# Patient Record
Sex: Male | Born: 2000 | Race: Black or African American | Hispanic: No | Marital: Single | Smoking: Never smoker
Health system: Southern US, Community
[De-identification: ages and names within clinical notes are randomized; demographics above are authoritative.]

---
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
## 2019-06-05 ENCOUNTER — Other Ambulatory Visit: Payer: Self-pay

## 2019-06-05 ENCOUNTER — Emergency Department: Payer: Self-pay

## 2019-06-05 ENCOUNTER — Emergency Department
Admission: EM | Admit: 2019-06-05 | Discharge: 2019-06-05 | Disposition: A | Discharge status: Home / Self Care | Payer: Self-pay | Attending: Emergency Medicine | Admitting: Emergency Medicine

## 2019-06-05 DIAGNOSIS — G44209 Tension-type headache, unspecified, not intractable: Secondary | ICD-10-CM | POA: Insufficient documentation

## 2019-06-05 DIAGNOSIS — Z79899 Other long term (current) drug therapy: Secondary | ICD-10-CM | POA: Insufficient documentation

## 2019-06-05 DIAGNOSIS — J029 Acute pharyngitis, unspecified: Secondary | ICD-10-CM | POA: Insufficient documentation

## 2019-06-05 LAB — GROUP A STREP BY PCR: Group A Strep by PCR: NOT DETECTED

## 2019-06-05 MED ORDER — LIDOCAINE VISCOUS HCL 2 % MT SOLN: 5.0000 mL | Freq: Four times a day (QID) | OROMUCOSAL | 0 refills | Status: AC | PRN

## 2019-06-05 MED ORDER — DICLOFENAC SODIUM 50 MG PO TBEC: 50.0000 mg | DELAYED_RELEASE_TABLET | Freq: Two times a day (BID) | ORAL | 0 refills | Status: AC

## 2019-06-05 NOTE — ED Provider Notes (Signed)
[facility] Emergency Department Provider Note   ____________________________________________   First MD Initiated Contact with Patient 06/05/19 [phone]     (approximate)  I have reviewed the triage vital signs and the nursing notes.   HISTORY  Chief Complaint Headache, Sore Throat, and Generalized Body Aches    HPI Chad Ortiz is a 18 y.o. male patient complain of sore throat and increasing bitemporal headache for 5 days.  Patient that head increases with flexion and lateral movements.  Patient state intimating transient vertigo.  Patient denies vision disturbance or weakness.  Patient rates his pain as 8/10.  Patient described pain is "achy". Mild relief with over-the-counter anti-inflammatory medications.      History reviewed. No pertinent past medical history.  There are no active problems to display for this patient.   History reviewed. No pertinent surgical history.  Prior to Admission medications   Medication Sig Start Date End Date Taking? Authorizing Provider  cetirizine HCl (ZYRTEC) 5 MG/5ML SOLN Take 5 mLs (5 mg total) by mouth daily. 03/21/17 04/04/17  Lannie Fields, PA-C  diclofenac (VOLTAREN) 50 MG EC tablet Take 1 tablet (50 mg total) by mouth 2 (two) times daily. 06/05/19   Sable Feil, PA-C  lidocaine (XYLOCAINE) 2 % solution Use as directed 5 mLs in the mouth or throat every 6 (six) hours as needed for mouth pain. Oral swish and swallow. 06/05/19   Sable Feil, PA-C    Allergies Patient has no known allergies.  No family history on file.  Social History Social History   Tobacco Use  . Smoking status: Never Smoker  Substance Use Topics  . Alcohol use: Not on file  . Drug use: Not on file    Review of Systems Constitutional: No fever/chills Eyes: No visual changes. ENT: Sore throat Cardiovascular: Denies chest pain. Respiratory: Denies shortness of breath. Gastrointestinal: No abdominal pain.  No nausea,  no vomiting.  No diarrhea.  No constipation. Genitourinary: Negative for dysuria. Musculoskeletal: Negative for back pain. Skin: Negative for rash. Neurological: Positive for headaches, but denies focal weakness or numbness.   ____________________________________________   PHYSICAL EXAM:  VITAL SIGNS: ED Triage Vitals  Enc Vitals Group     BP 06/05/19 0857 (!) 145/70     Pulse Rate 06/05/19 0857 71     Resp 06/05/19 0857 18     Temp 06/05/19 0857 98.7 F (37.1 C)     Temp Source 06/05/19 0857 Oral     SpO2 06/05/19 0857 97 %     Weight 06/05/19 0858 135 lb (61.2 kg)     Height 06/05/19 0858 5\' 10"  (1.778 m)     Head Circumference --      Peak Flow --      Pain Score 06/05/19 0858 8     Pain Loc --      Pain Edu? --      Excl. in [place]? --    Constitutional: Alert and oriented. Well appearing and in no acute distress. Eyes: Conjunctivae are normal. PERRL. EOMI. Head: Atraumatic. Mouth/Throat: Mucous membranes are moist.  Oropharynx erythematous.  No visible exudate. Neck: No stridor.  Hematological/Lymphatic/Immunilogical: No cervical lymphadenopathy. Cardiovascular: Normal rate, regular rhythm. Grossly normal heart sounds.  Good peripheral circulation. Respiratory: Normal respiratory effort.  No retractions. Lungs CTAB. Neurologic:  Normal speech and language. No gross focal neurologic deficits are appreciated. No gait instability. Skin:  Skin is warm, dry and intact. No rash noted.  ____________________________________________  LABS (all labs ordered are listed, but only abnormal results are displayed)  Labs Reviewed  GROUP A STREP BY PCR   ____________________________________________  EKG   ____________________________________________  RADIOLOGY  ED MD interpretation:    Official radiology report(s): Ct Head Wo Contrast  Result Date: 06/05/2019 CLINICAL DATA:  18 year old with severe headache. EXAM: CT HEAD WITHOUT CONTRAST TECHNIQUE: Contiguous axial  images were obtained from the base of the skull through the vertex without intravenous contrast. COMPARISON:  09/24/2010 FINDINGS: Brain: No evidence of acute infarction, hemorrhage, hydrocephalus, extra-axial collection or mass lesion/mass effect. Vascular: No hyperdense vessel or unexpected calcification. Skull: Normal. Negative for fracture or focal lesion. Sinuses/Orbits: No acute finding. Other: None. IMPRESSION: Negative head CT. Electronically Signed   By: Richarda OverlieAdam  Henn M.D.   On: 06/05/2019 10:10    ____________________________________________   PROCEDURES  Procedure(s) performed (including Critical Care):  Procedures   ____________________________________________   INITIAL IMPRESSION / ASSESSMENT AND PLAN / ED COURSE  As part of my medical decision making, I reviewed the following data within the electronic MEDICAL RECORD NUMBER         Chad Ortiz was evaluated in Emergency Department on 06/05/2019 for the symptoms described in the history of present illness. He was evaluated in the context of the global COVID-19 pandemic, which necessitated consideration that the patient might be at risk for infection with the SARS-CoV-2 virus that causes COVID-19. Institutional protocols and algorithms that pertain to the evaluation of patients at risk for COVID-19 are in a state of rapid change based on information released by regulatory bodies including the CDC and federal and state organizations. These policies and algorithms were followed during the patient's care in the ED.  Patient presents with left temporal headache and sore throat.  Physical exam, CT of the head, and rapid strep test were discussed with patient and mother.  Patient given discharge care instruction advised take medication as directed.  Advised to follow-up with PCP.      ____________________________________________   FINAL CLINICAL IMPRESSION(S) / ED DIAGNOSES  Final diagnoses:  Sore throat  Tension headache      ED Discharge Orders         Ordered    diclofenac (VOLTAREN) 50 MG EC tablet  2 times daily     06/05/19 1117    lidocaine (XYLOCAINE) 2 % solution  Every 6 hours PRN     06/05/19 1117           Note:  This document was prepared using Dragon voice recognition software and may include unintentional dictation errors.    Joni ReiningSmith, Brinton Brandel K, PA-C 06/05/19 1119    Emily FilbertWilliams, Jonathan E, MD 06/05/19 1323

## 2019-06-05 NOTE — ED Notes (Signed)
See triage note  Presents with headache for about 2 days   States the pain is worse when he bends down  Pain starts at temporal area and moves  No trauma or n/v or fever

## 2019-06-05 NOTE — ED Triage Notes (Signed)
Headache, sore throat and body aches X 1 week. Pt alert and oriented X4, cooperative, RR even and unlabored, color WNL. Pt in NAD.

## 2021-01-27 IMAGING — CT CT HEAD WITHOUT CONTRAST
3 series · 16 of 47 positions shown, 19 images · non-contrast
Comparison: 09/24/2010

CLINICAL DATA: 18-year-old with severe headache.

EXAM:
CT HEAD WITHOUT CONTRAST
TECHNIQUE: Contiguous axial images were obtained from the base of the skull
through the vertex without intravenous contrast.

[Series 2: head wo · axial · 0.46mm/px · z∈[-143,-8]mm · 10 of 33 slices shown, 13 images]
[im 3/33  brain]
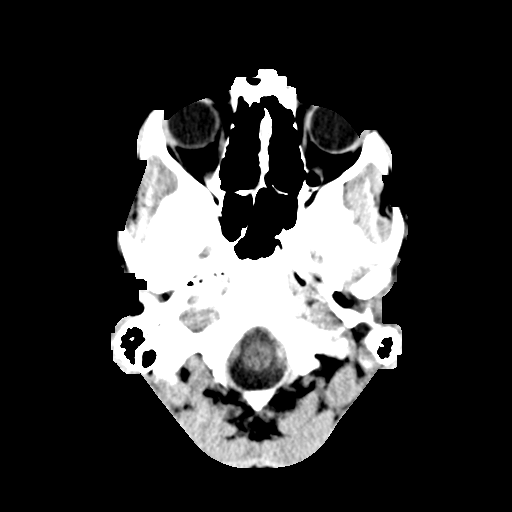
[im 3/33  bone]
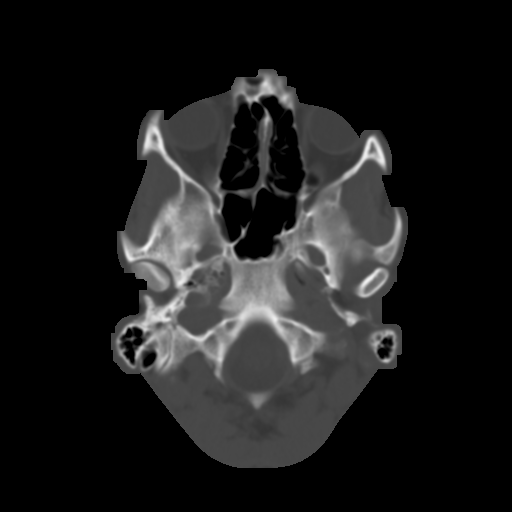
[im 6/33  brain]
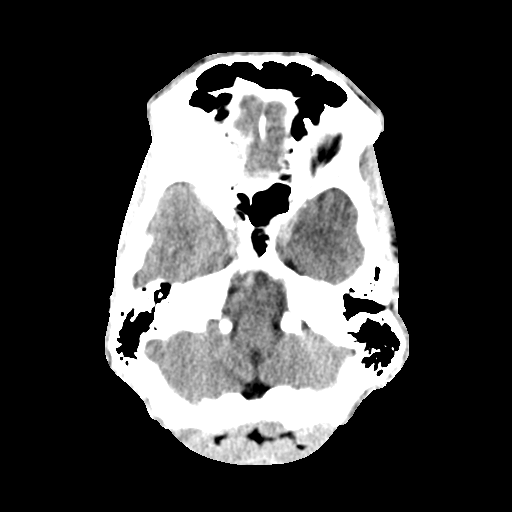
[im 9/33  brain]
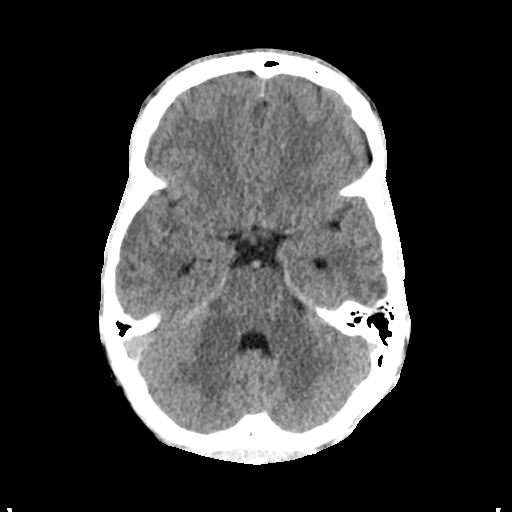
[im 12/33  brain]
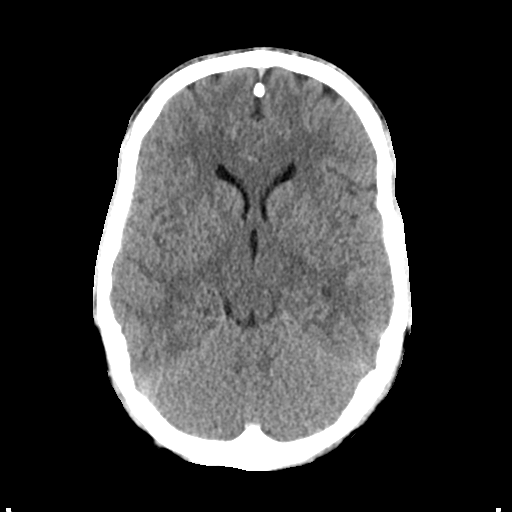
[im 15/33  brain]
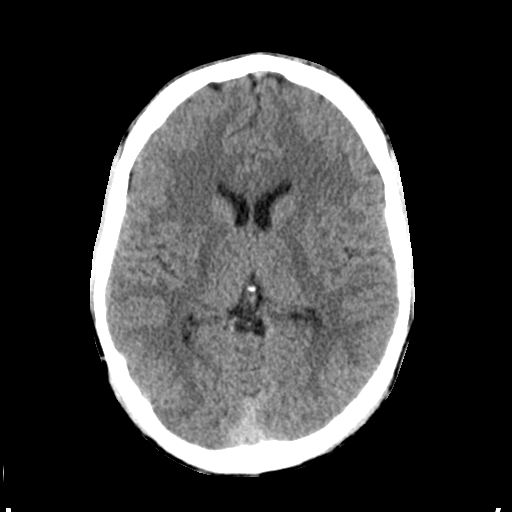
[im 15/33  bone]
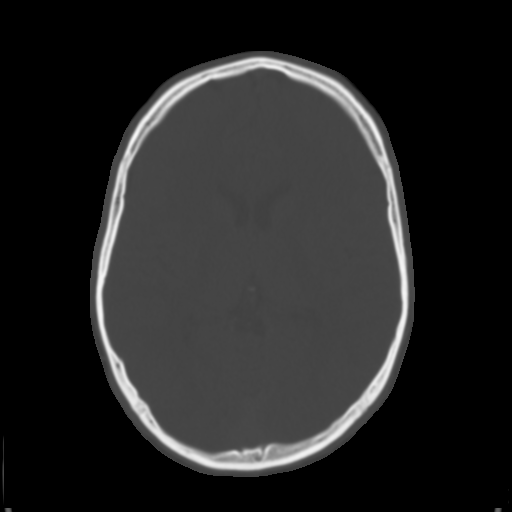
[im 18/33  brain]
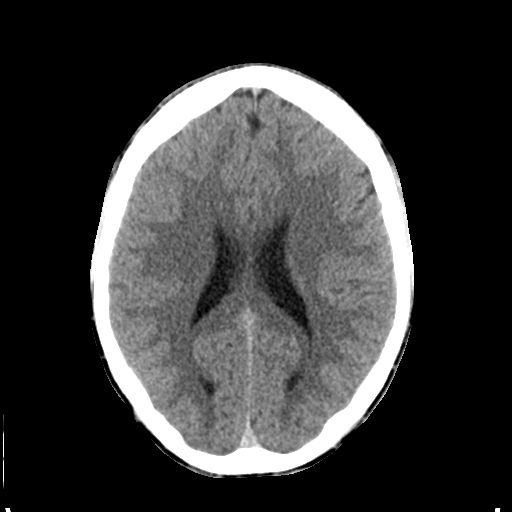
[im 21/33  brain]
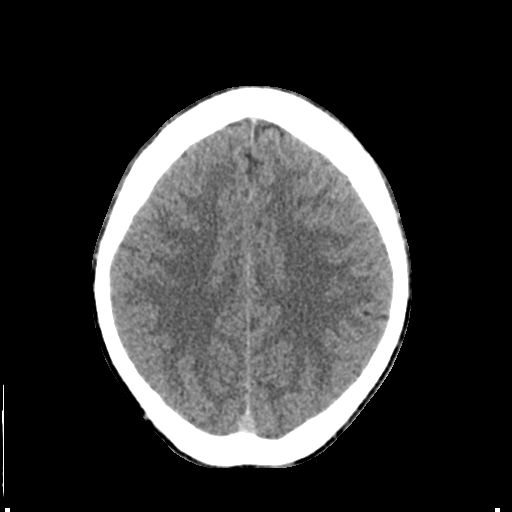
[im 25/33  brain]
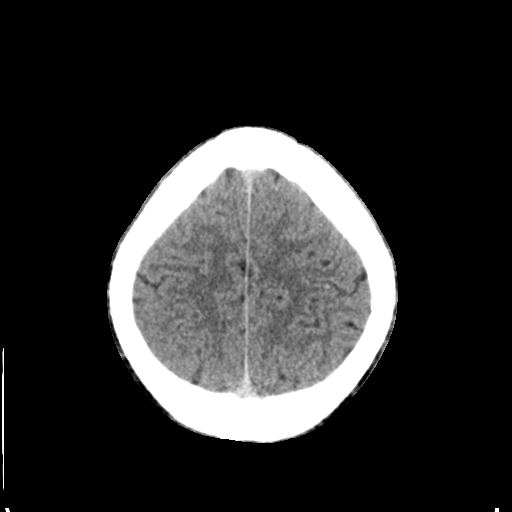
[im 27/33  brain]
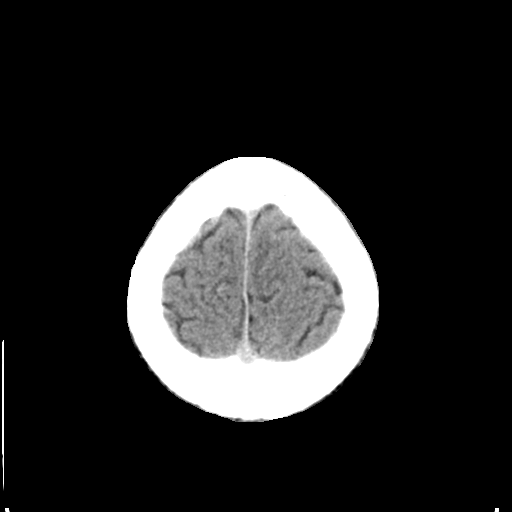
[im 27/33  bone]
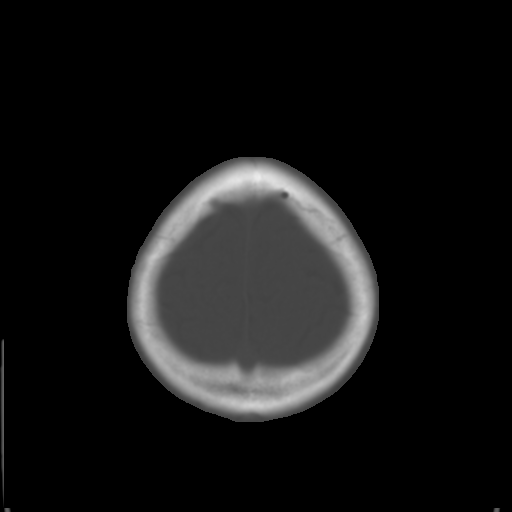
[im 30/33  brain]
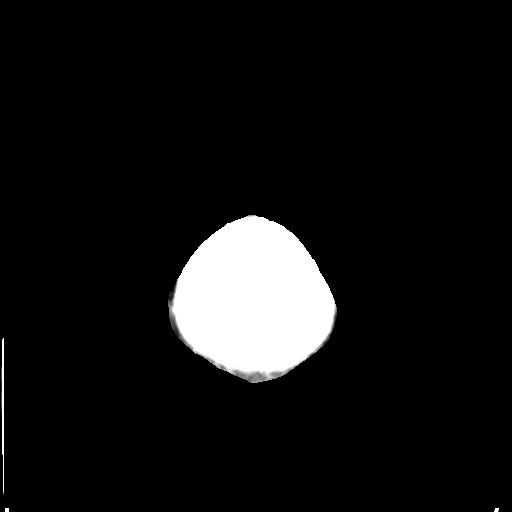

[Series 4: coronal soft tissue · coronal · 0.33mm/px · 3 of 64 slices shown]
[im 22/64  brain]
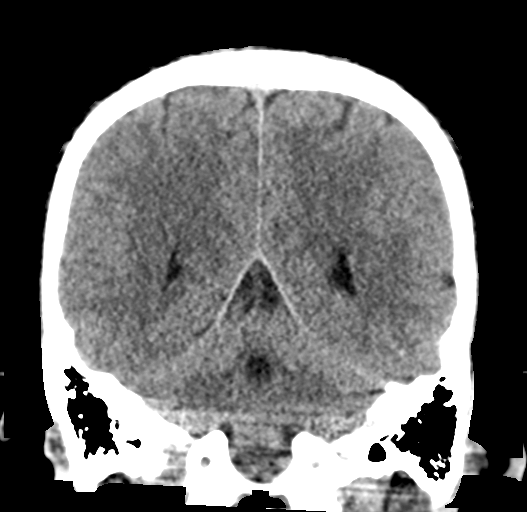
[im 29/64  brain]
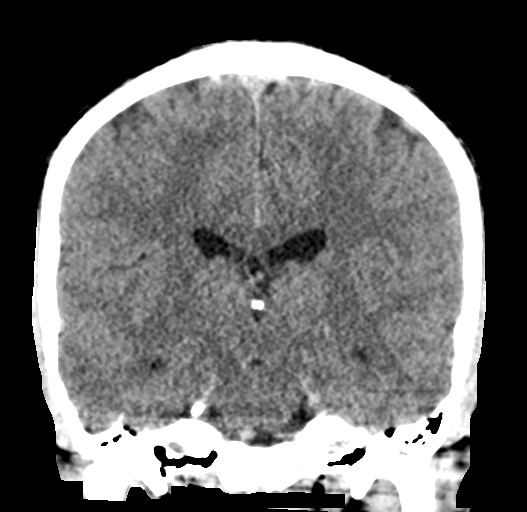
[im 36/64  brain]
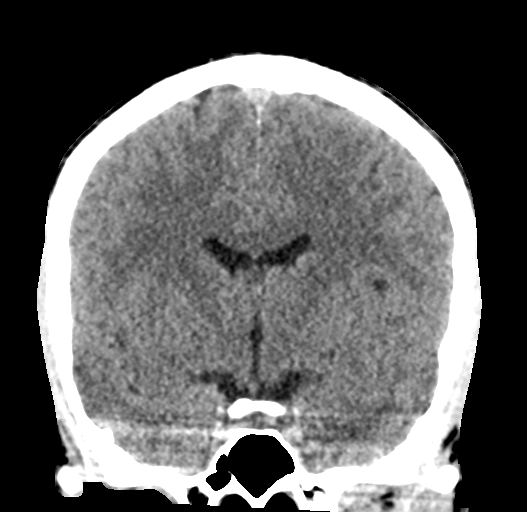

[Series 5: sagittal soft tissue · sagittal · 0.31mm/px · 3 of 53 slices shown]
[im 18/53  brain]
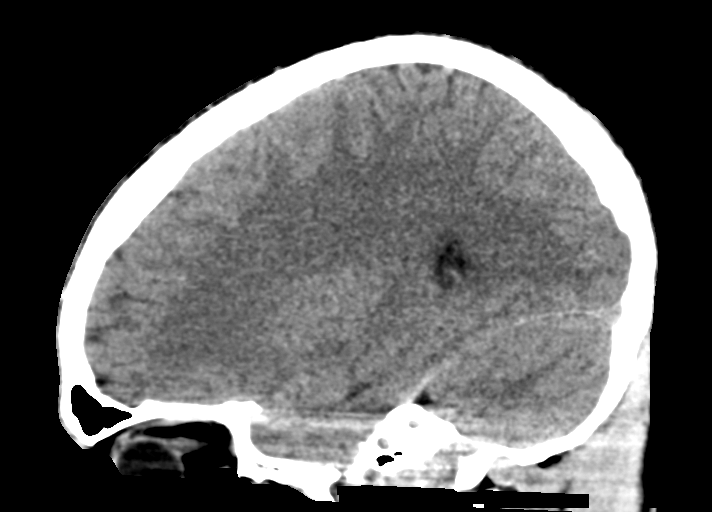
[im 27/53  brain]
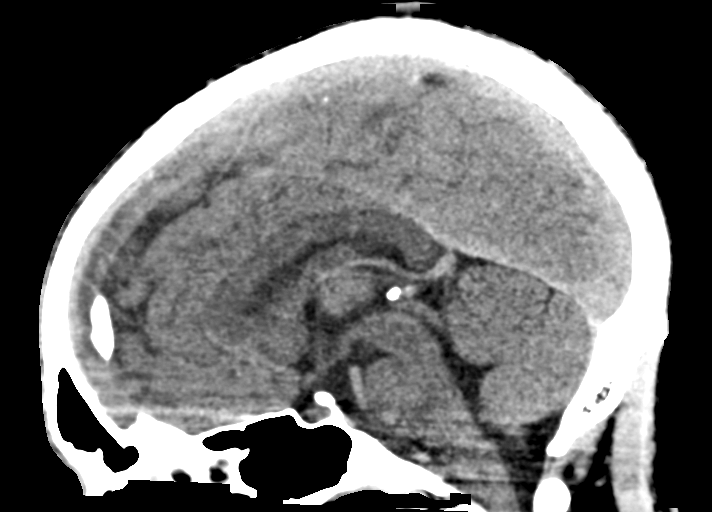
[im 35/53  brain]
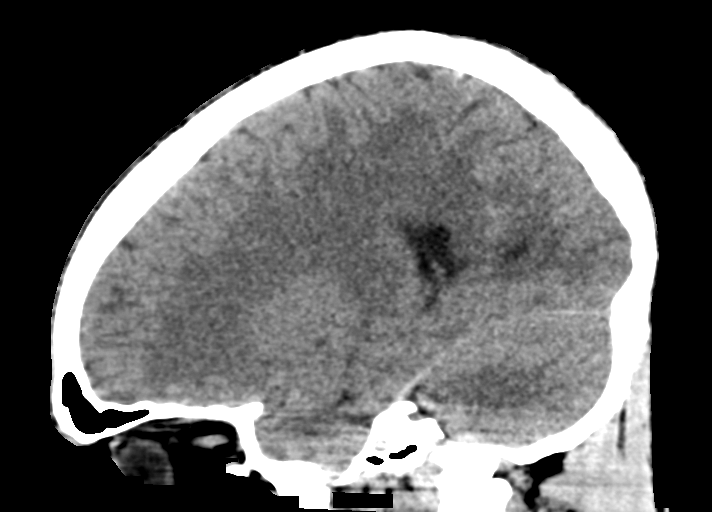

[16 of 47 positions shown; findings below may reference images not displayed]

FINDINGS: Brain: No evidence of acute infarction, hemorrhage, hydrocephalus,
extra-axial collection or mass lesion/mass effect.

Vascular: No hyperdense vessel or unexpected calcification.

Skull: Normal. Negative for fracture or focal lesion.

Sinuses/Orbits: No acute finding.

Other: None.
IMPRESSION: Negative head CT.
